# Patient Record
Sex: Female | Born: 1990 | Race: Black or African American | Hispanic: No | Marital: Single | Smoking: Current every day smoker
Health system: Southern US, Community
[De-identification: ages and names within clinical notes are randomized; demographics above are authoritative.]

---

## 2014-01-24 ENCOUNTER — Emergency Department (HOSPITAL_COMMUNITY): Payer: Medicaid - Out of State

## 2014-01-24 ENCOUNTER — Encounter (HOSPITAL_COMMUNITY): Payer: Self-pay | Admitting: Emergency Medicine

## 2014-01-24 ENCOUNTER — Emergency Department (HOSPITAL_COMMUNITY)
Admission: EM | Admit: 2014-01-24 | Discharge: 2014-01-24 | Disposition: A | Discharge status: Home / Self Care | Payer: Medicaid - Out of State | Attending: Emergency Medicine | Admitting: Emergency Medicine

## 2014-01-24 DIAGNOSIS — F17200 Nicotine dependence, unspecified, uncomplicated: Secondary | ICD-10-CM | POA: Diagnosis not present

## 2014-01-24 DIAGNOSIS — T189XXA Foreign body of alimentary tract, part unspecified, initial encounter: Secondary | ICD-10-CM | POA: Diagnosis present

## 2014-01-24 DIAGNOSIS — Y9389 Activity, other specified: Secondary | ICD-10-CM | POA: Diagnosis not present

## 2014-01-24 DIAGNOSIS — IMO0002 Reserved for concepts with insufficient information to code with codable children: Secondary | ICD-10-CM | POA: Insufficient documentation

## 2014-01-24 DIAGNOSIS — Y929 Unspecified place or not applicable: Secondary | ICD-10-CM | POA: Diagnosis not present

## 2014-01-24 NOTE — ED Notes (Signed)
Pt reports she swallowed lip ring last night. States "I can feel it just sitting in my throat." Pt denies any difficulty breathing, swelling or pain. States "it just feels weird." Pt AO x4, speaking in complete sentences. NAD.

## 2014-01-24 NOTE — ED Provider Notes (Signed)
CSN: [id number]     Arrival date & time 01/24/14  1608 History  This chart was scribed for non-physician practitioner, Emilia BeckKaitlyn Breaker Springer, PA-C working with Teresa RazorStephen Kohut, MD by Teresa Kelly, ED scribe. This patient was seen in room TR10C/TR10C and the patient's care was started at 6:52 PM.   Chief Complaint  Patient presents with  . Swallowed Foreign Body   The history is provided by the patient. No language interpreter was used.   HPI Comments: Teresa Kelly is a 23 y.o. female who presents to the Emergency Department complaining of a foreign body in her throat. She states she she swallowed her metal lip ring last night and it still feels like it is stuck. Denies sore throat, trouble swallowing, difficulty breathing.   History reviewed. No pertinent past medical history. History reviewed. No pertinent past surgical history. History reviewed. No pertinent family history. History  Substance Use Topics  . Smoking status: Current Every Day Smoker -- 0.25 packs/day    Types: Cigarettes  . Smokeless tobacco: Not on file  . Alcohol Use: 1.2 oz/week    2 Shots of liquor per week   OB History   Grav Para Term Preterm Abortions TAB SAB Ect Mult Living            0     Review of Systems  HENT: Negative for sore throat and trouble swallowing.   All other systems reviewed and are negative.  Allergies  Review of patient's allergies indicates no known allergies.  Home Medications   Prior to Admission medications   Not on File   BP 120/86  Pulse 86  Temp(Src) 99.2 F (37.3 C) (Oral)  Resp 18  Ht 5\' 6"  (1.676 m)  Wt 294 lb (133.358 kg)  BMI 47.48 kg/m2  SpO2 99%  LMP 01/02/2014  Physical Exam  Nursing note and vitals reviewed. Constitutional: She is oriented to person, place, and time. She appears well-developed and well-nourished. No distress.  HENT:  Head: Normocephalic and atraumatic.  Eyes: Conjunctivae and EOM are normal.  Neck: Neck supple. No tracheal deviation  present.  Cardiovascular: Normal rate.   Pulmonary/Chest: Effort normal. No respiratory distress.  Abdominal: Soft. There is no tenderness.  Musculoskeletal: Normal range of motion.  Neurological: She is alert and oriented to person, place, and time.  Skin: Skin is warm and dry.  Psychiatric: She has a normal mood and affect. Her behavior is normal.    ED Course  Procedures (including critical care time)  DIAGNOSTIC STUDIES: Oxygen Saturation is 99% on RA, normal by my interpretation.    COORDINATION OF CARE: 6:53 PM-Advised pt of xray results. Discussed treatment plan which includes discharge with pt at bedside and pt agreed to plan. Advised her that the ring should pass on its own.   Labs Review Labs Reviewed - No data to display  Imaging Review Dg Neck Soft Tissue  01/24/2014   CLINICAL DATA:  Swallowed a lip brain 1 day ago, feels like it stuck in her throat  EXAM: NECK SOFT TISSUES - 1+ VIEW  COMPARISON:  None.  FINDINGS: Prevertebral soft tissues normal thickness.  Epiglottis and aryepiglottic folds grossly normal.  No radiopaque foreign body identified at the pharynx, hypopharynx or cervical region.  Scattered laryngeal cartilage calcifications.  Osseous structures unremarkable.  Airway patent.  Multiple bobby pins project over the head.  IMPRESSION: No radiopaque foreign bodies identified at the pharynx, hypopharynx or cervical region.   Electronically Signed   By: Teresa PuntMark  Kelly M.D.  On: 01/24/2014 17:53   Dg Abd Acute W/chest  01/24/2014   CLINICAL DATA:  Swallowed her lip ring 1 day ago  EXAM: ACUTE ABDOMEN SERIES (ABDOMEN 2 VIEW & CHEST 1 VIEW)  COMPARISON:  None.  FINDINGS: There is no evidence of dilated bowel loops or free intraperitoneal air. No radiopaque calculi or other significant radiographic abnormality is seen. Heart size and mediastinal contours are within normal limits. Both lungs are clear.  IMPRESSION: Negative abdominal radiographs.  No acute cardiopulmonary  disease.   Electronically Signed   By: Teresa Kelly M.D.   On: 01/24/2014 17:55     EKG Interpretation None      MDM   Final diagnoses:  Swallowed foreign body, initial encounter    6:58 PM Patient's xrays show no foreign body. Patient has no chest pain or other symptoms. Patient will be discharged with instructions to return with worsening or concerning symptoms. Vitals stable and patient afebrile.   I personally performed the services described in this documentation, which was scribed in my presence. The recorded information has been reviewed and is accurate.  [name], PA-C 01/24/14 1900

## 2014-01-24 NOTE — Discharge Instructions (Signed)
Return to the ED with worsening or concerning symptoms.  °

## 2014-01-29 NOTE — ED Provider Notes (Signed)
Medical screening examination/treatment/procedure(s) were performed by non-physician practitioner and as supervising physician I was immediately available for consultation/collaboration.   EKG Interpretation None       Coby Antrobus, MD 01/29/14 1615 

## 2015-06-22 IMAGING — CR DG ABDOMEN ACUTE W/ 1V CHEST
4 series · 4 of 4 positions shown · non-contrast
Comparison: None.

CLINICAL DATA: Swallowed her lip ring 1 day ago

EXAM:
ACUTE ABDOMEN SERIES (ABDOMEN 2 VIEW & CHEST 1 VIEW)

[w chest pa]
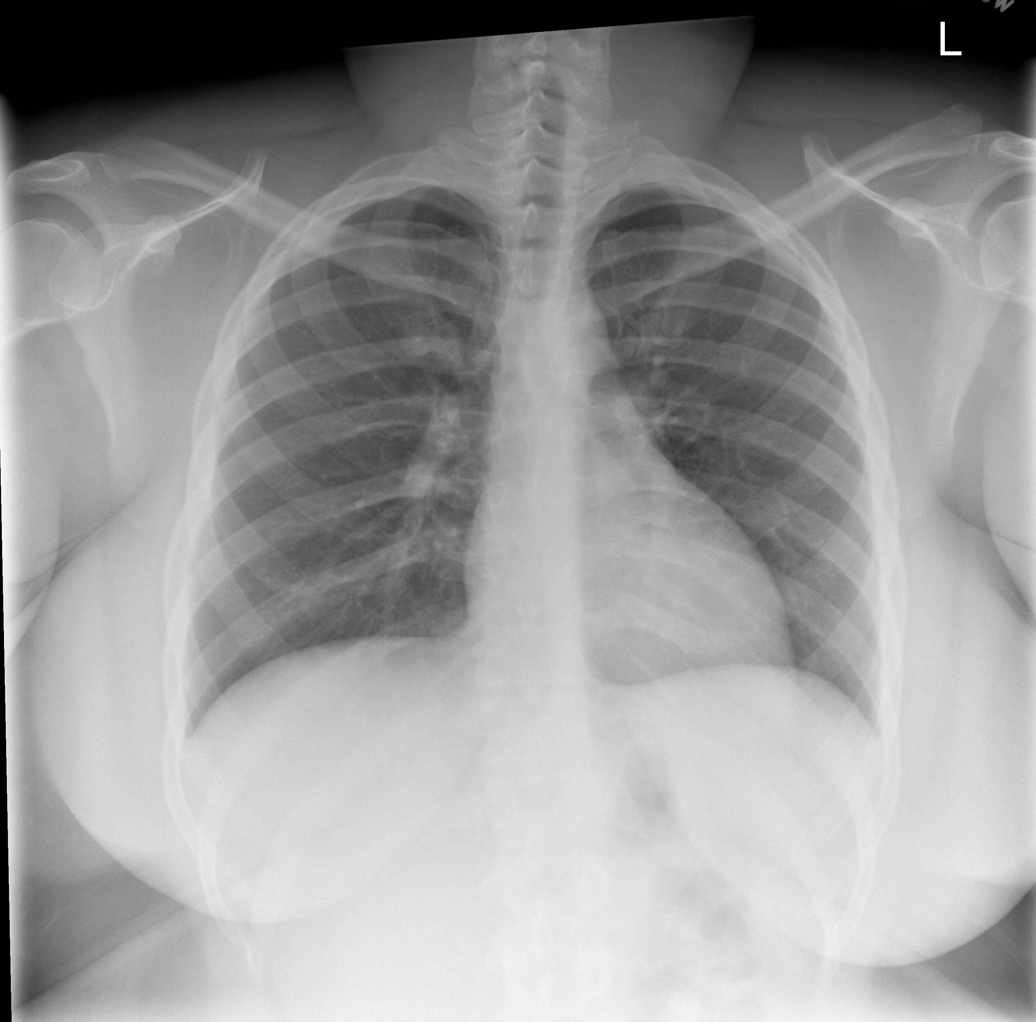

[w abdomen upright]
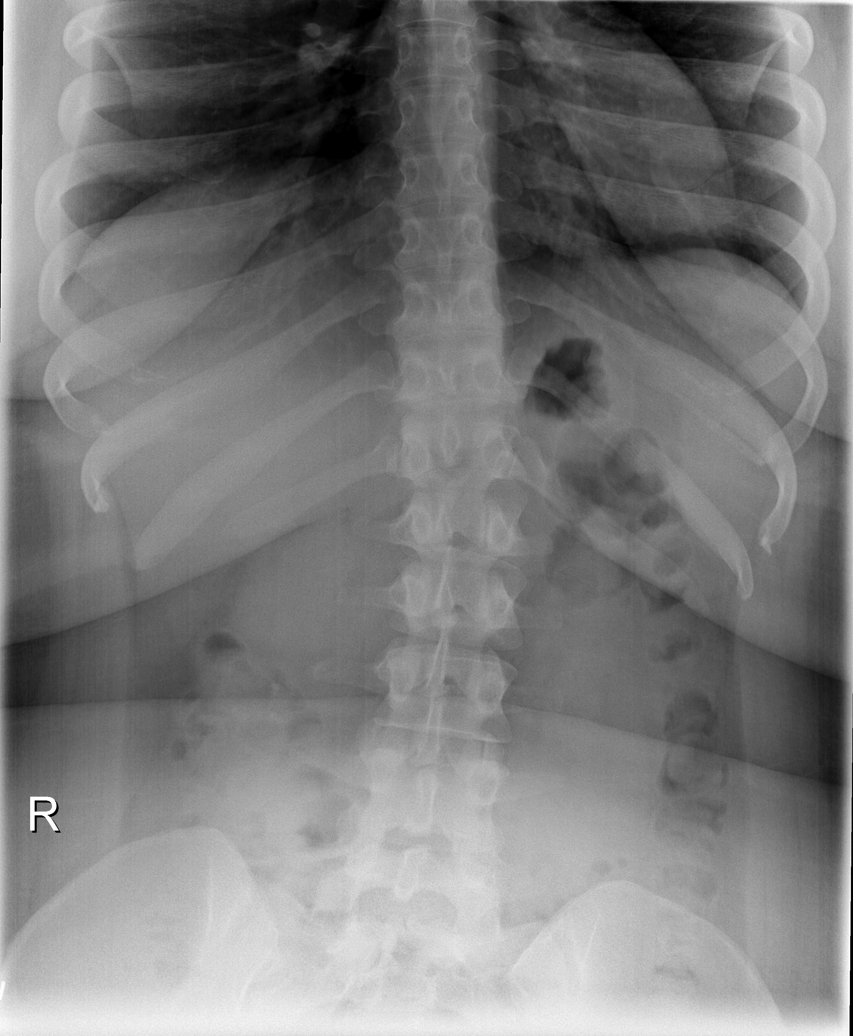

[t abdomen supine (1 of 2)]
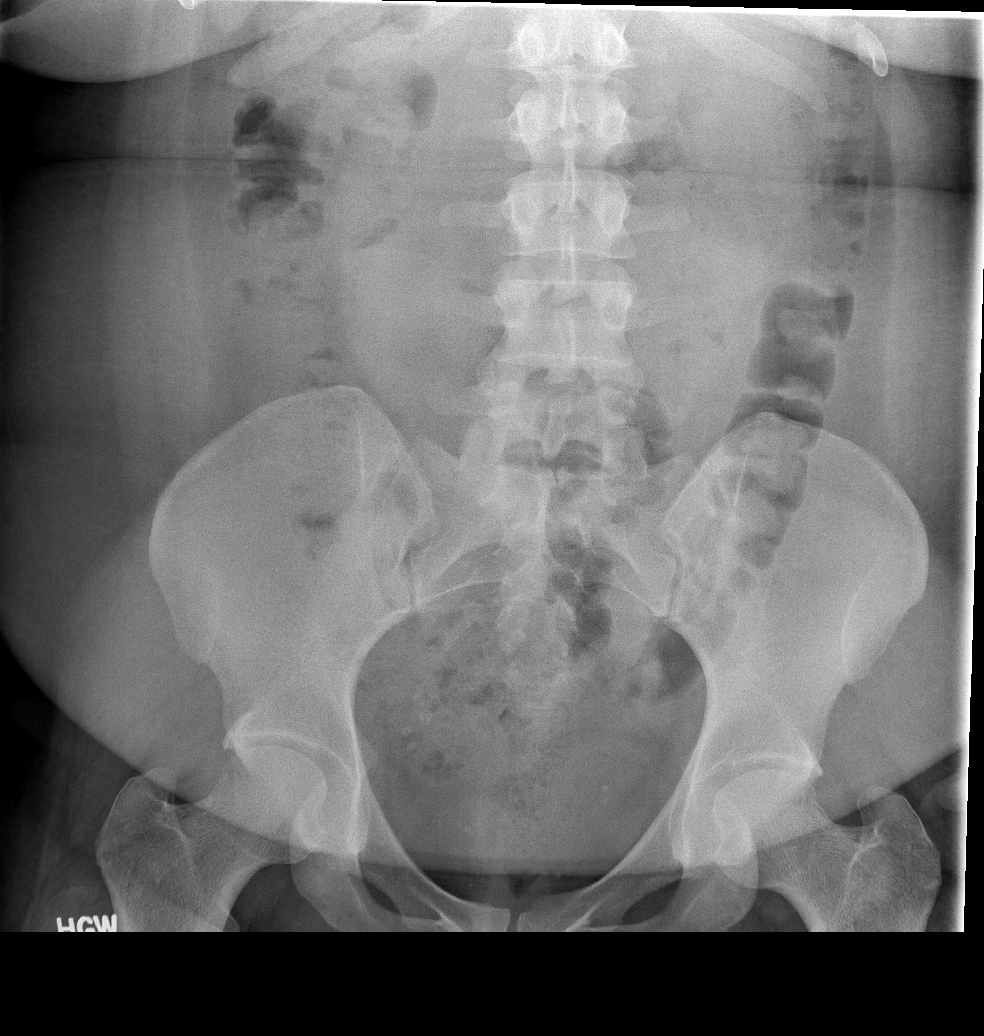

[t abdomen supine (2 of 2)]
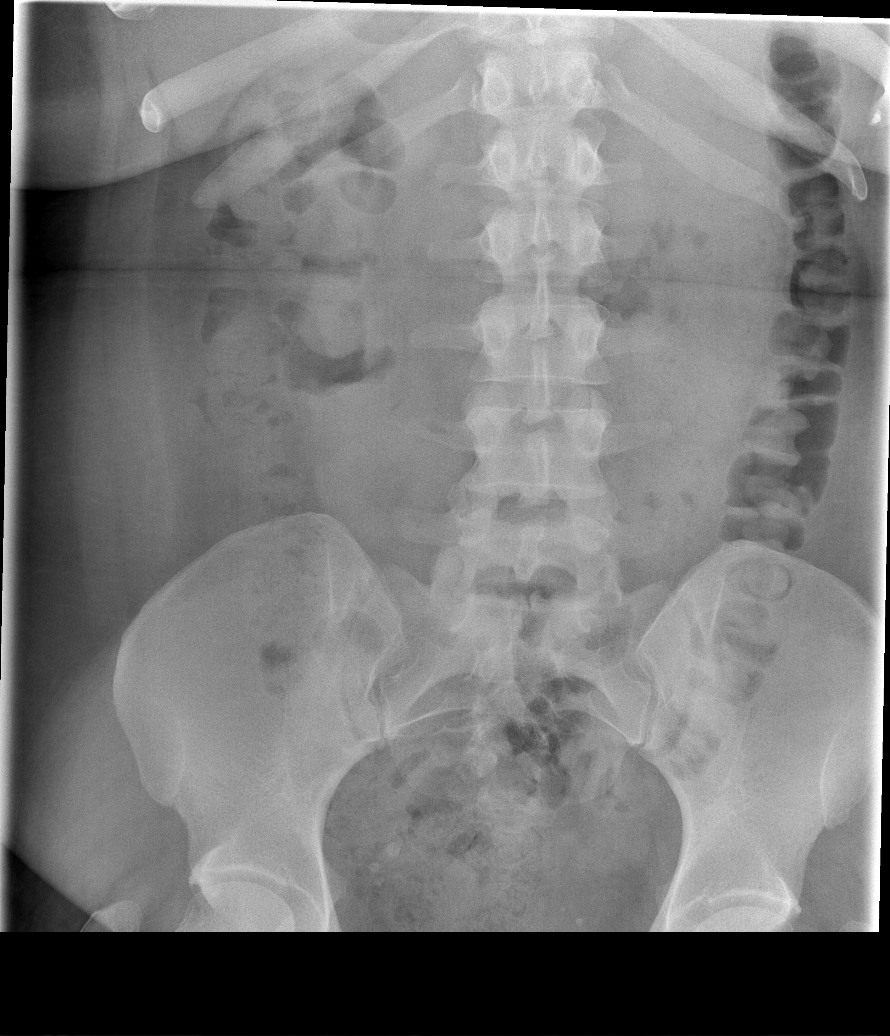

[4 of 4 positions shown; findings below may reference images not displayed]

FINDINGS: There is no evidence of dilated bowel loops or free intraperitoneal
air. No radiopaque calculi or other significant radiographic
abnormality is seen. Heart size and mediastinal contours are within
normal limits. Both lungs are clear.
IMPRESSION: Negative abdominal radiographs.  No acute cardiopulmonary disease.
